# Patient Record
Sex: Female | Born: 1980 | Race: White | Hispanic: No | Marital: Single | Smoking: Former smoker
Health system: Southern US, Community
[De-identification: ages and names within clinical notes are randomized; demographics above are authoritative.]

## PROBLEM LIST (undated history)

## (undated) DIAGNOSIS — G40909 Epilepsy, unspecified, not intractable, without status epilepticus: Secondary | ICD-10-CM

## (undated) DIAGNOSIS — R51 Headache: Secondary | ICD-10-CM

## (undated) DIAGNOSIS — Z8614 Personal history of Methicillin resistant Staphylococcus aureus infection: Secondary | ICD-10-CM

## (undated) DIAGNOSIS — M1990 Unspecified osteoarthritis, unspecified site: Secondary | ICD-10-CM

## (undated) DIAGNOSIS — K529 Noninfective gastroenteritis and colitis, unspecified: Secondary | ICD-10-CM

## (undated) DIAGNOSIS — R112 Nausea with vomiting, unspecified: Secondary | ICD-10-CM

## (undated) DIAGNOSIS — N281 Cyst of kidney, acquired: Secondary | ICD-10-CM

## (undated) DIAGNOSIS — R519 Headache, unspecified: Secondary | ICD-10-CM

## (undated) DIAGNOSIS — Z789 Other specified health status: Secondary | ICD-10-CM

## (undated) DIAGNOSIS — N390 Urinary tract infection, site not specified: Secondary | ICD-10-CM

## (undated) DIAGNOSIS — J189 Pneumonia, unspecified organism: Secondary | ICD-10-CM

## (undated) DIAGNOSIS — Z9889 Other specified postprocedural states: Secondary | ICD-10-CM

## (undated) DIAGNOSIS — D219 Benign neoplasm of connective and other soft tissue, unspecified: Secondary | ICD-10-CM

## (undated) DIAGNOSIS — N200 Calculus of kidney: Secondary | ICD-10-CM

## (undated) DIAGNOSIS — F419 Anxiety disorder, unspecified: Secondary | ICD-10-CM

## (undated) HISTORY — PX: CHOLECYSTECTOMY: SHX55

## (undated) HISTORY — PX: UTERINE ARTERY EMBOLIZATION: SHX2629

## (undated) HISTORY — PX: WISDOM TOOTH EXTRACTION: SHX21

## (undated) HISTORY — PX: ABDOMINAL HYSTERECTOMY: SHX81

---

## 2012-06-12 ENCOUNTER — Encounter (HOSPITAL_COMMUNITY): Payer: Self-pay | Admitting: *Deleted

## 2012-06-12 ENCOUNTER — Emergency Department (HOSPITAL_COMMUNITY): Payer: Medicaid Other

## 2012-06-12 ENCOUNTER — Emergency Department (HOSPITAL_COMMUNITY)
Admission: EM | Admit: 2012-06-12 | Discharge: 2012-06-13 | Disposition: A | Discharge status: Home / Self Care | Payer: Medicaid Other | Attending: Emergency Medicine | Admitting: Emergency Medicine

## 2012-06-12 DIAGNOSIS — R5381 Other malaise: Secondary | ICD-10-CM | POA: Insufficient documentation

## 2012-06-12 DIAGNOSIS — N281 Cyst of kidney, acquired: Secondary | ICD-10-CM

## 2012-06-12 DIAGNOSIS — R109 Unspecified abdominal pain: Secondary | ICD-10-CM

## 2012-06-12 DIAGNOSIS — R5383 Other fatigue: Secondary | ICD-10-CM | POA: Insufficient documentation

## 2012-06-12 DIAGNOSIS — N83209 Unspecified ovarian cyst, unspecified side: Secondary | ICD-10-CM | POA: Insufficient documentation

## 2012-06-12 DIAGNOSIS — R142 Eructation: Secondary | ICD-10-CM | POA: Insufficient documentation

## 2012-06-12 DIAGNOSIS — R42 Dizziness and giddiness: Secondary | ICD-10-CM | POA: Insufficient documentation

## 2012-06-12 DIAGNOSIS — G40909 Epilepsy, unspecified, not intractable, without status epilepticus: Secondary | ICD-10-CM | POA: Insufficient documentation

## 2012-06-12 DIAGNOSIS — K5289 Other specified noninfective gastroenteritis and colitis: Secondary | ICD-10-CM | POA: Insufficient documentation

## 2012-06-12 DIAGNOSIS — R110 Nausea: Secondary | ICD-10-CM | POA: Insufficient documentation

## 2012-06-12 DIAGNOSIS — Q619 Cystic kidney disease, unspecified: Secondary | ICD-10-CM | POA: Insufficient documentation

## 2012-06-12 DIAGNOSIS — R141 Gas pain: Secondary | ICD-10-CM | POA: Insufficient documentation

## 2012-06-12 DIAGNOSIS — K529 Noninfective gastroenteritis and colitis, unspecified: Secondary | ICD-10-CM

## 2012-06-12 DIAGNOSIS — Z87891 Personal history of nicotine dependence: Secondary | ICD-10-CM | POA: Insufficient documentation

## 2012-06-12 DIAGNOSIS — R197 Diarrhea, unspecified: Secondary | ICD-10-CM | POA: Insufficient documentation

## 2012-06-12 DIAGNOSIS — Z79899 Other long term (current) drug therapy: Secondary | ICD-10-CM | POA: Insufficient documentation

## 2012-06-12 HISTORY — DX: Epilepsy, unspecified, not intractable, without status epilepticus: G40.909

## 2012-06-12 LAB — BASIC METABOLIC PANEL
BUN: 10 mg/dL (ref 6–23)
Calcium: 9.6 mg/dL (ref 8.4–10.5)
Creatinine, Ser: 0.69 mg/dL (ref 0.50–1.10)
GFR calc Af Amer: >90 mL/min (ref >90)

## 2012-06-12 LAB — CBC WITH DIFFERENTIAL/PLATELET
Basophils Absolute: 0 10*3/uL (ref 0.0–0.1)
Basophils Relative: 0 % (ref 0–1)
Eosinophils Relative: 1 % (ref 0–5)
HCT: 41.5 % (ref 36.0–46.0)
MCH: 31.9 pg (ref 26.0–34.0)
MCHC: 36.9 g/dL — ABNORMAL HIGH (ref 30.0–36.0)
MCV: 86.5 fL (ref 78.0–100.0)
Monocytes Absolute: 0.6 10*3/uL (ref 0.1–1.0)
RDW: 12.6 % (ref 11.5–15.5)

## 2012-06-12 LAB — HEPATIC FUNCTION PANEL
Alkaline Phosphatase: 67 U/L (ref 39–117)
Bilirubin, Direct: <0.1 mg/dL (ref 0.0–0.3)
Total Protein: 7.3 g/dL (ref 6.0–8.3)

## 2012-06-12 LAB — URINALYSIS, ROUTINE W REFLEX MICROSCOPIC
Bilirubin Urine: NEGATIVE
Hgb urine dipstick: NEGATIVE
Ketones, ur: NEGATIVE mg/dL
Protein, ur: NEGATIVE mg/dL
Urobilinogen, UA: 1 mg/dL (ref 0.0–1.0)

## 2012-06-12 LAB — LIPASE, BLOOD: Lipase: 39 U/L (ref 11–59)

## 2012-06-12 MED ORDER — FENTANYL CITRATE 0.05 MG/ML IJ SOLN
50.0000 ug | Freq: Once | INTRAMUSCULAR | Status: AC
  Administered 2012-06-12: 50 ug via INTRAVENOUS
  Filled 2012-06-12: qty 2

## 2012-06-12 MED ORDER — FENTANYL CITRATE 0.05 MG/ML IJ SOLN
100.0000 ug | Freq: Once | INTRAMUSCULAR | Status: AC
  Administered 2012-06-12: 100 ug via INTRAVENOUS
  Filled 2012-06-12: qty 2

## 2012-06-12 MED ORDER — ONDANSETRON HCL 4 MG/2ML IJ SOLN
4.0000 mg | Freq: Once | INTRAMUSCULAR | Status: AC
  Administered 2012-06-12: 4 mg via INTRAVENOUS
  Filled 2012-06-12: qty 2

## 2012-06-12 MED ORDER — HYDROCODONE-ACETAMINOPHEN 5-325 MG PO TABS: 1.0000 | ORAL_TABLET | ORAL | Status: DC | PRN

## 2012-06-12 MED ORDER — IOHEXOL 350 MG/ML SOLN
100.0000 mL | Freq: Once | INTRAVENOUS | Status: AC | PRN
  Administered 2012-06-12: 100 mL via INTRAVENOUS

## 2012-06-12 MED ORDER — SODIUM CHLORIDE 0.9 % IV BOLUS (SEPSIS)
1000.0000 mL | Freq: Once | INTRAVENOUS | Status: AC
  Administered 2012-06-12: 1000 mL via INTRAVENOUS

## 2012-06-12 MED ORDER — DIPHENOXYLATE-ATROPINE 2.5-0.025 MG PO TABS: 2.0000 | ORAL_TABLET | Freq: Four times a day (QID) | ORAL | Status: DC | PRN

## 2012-06-12 MED ORDER — DIPHENOXYLATE-ATROPINE 2.5-0.025 MG PO TABS
2.0000 | ORAL_TABLET | Freq: Once | ORAL | Status: AC
  Administered 2012-06-12: 2 via ORAL
  Filled 2012-06-12: qty 2

## 2012-06-12 NOTE — ED Notes (Signed)
Patient presented today with on going diarrhea since Saturday. She has taken OTC anti-diarrheal meds with no success. She was seen at [facility] on Saturday and was sent home with the Z-pack. She has right lower quad abdominal pain that radiates to midline and up to her diaphragm. She states she has had no oral intake in days because it goes right thru her.

## 2012-06-12 NOTE — ED Notes (Signed)
Blanket given to pt

## 2012-06-12 NOTE — ED Notes (Signed)
Called CT to advised that patient has finished contrast

## 2012-06-12 NOTE — ED Notes (Signed)
Pt has had diarrhea for one week and states that she has had right lower abdominal pain that is shooting up right side to ribs.  No vomiting

## 2012-06-12 NOTE — ED Notes (Signed)
Pt is currently in the bathroom.   

## 2012-06-12 NOTE — ED Provider Notes (Signed)
History     CSN: [id number]  Arrival date & time 06/12/12  1655   First MD Initiated Contact with Patient 06/12/12 1854      Chief Complaint  Patient presents with  . Diarrhea    1 week  . Abdominal Pain    (Consider location/radiation/quality/duration/timing/severity/associated sxs/prior treatment) HPI Comments: Janice Lambert presents with a one-week history of light brown nonbloody diarrhea occurring "anytime she eats or drinks" but also wakes her up in one to 2 times per night as well.  She reports abdominal pain that was at first generalized but has now localized to the right and mid lower abdomen and radiates into her right flank and right upper quadrant.  She does have nausea without emesis and denies fevers or chills.  She also denies dysuria and hematuria.  She has not had by mouth intake in 48 hours and every 2 anorexia and worsened symptoms when she eats and drinks.  She has taken an OTC generic antidiarrheal medicine with no relief.  She lives at home with husband and several children all whom are well.  She was seen 5 days ago at an urgent care Center in [place] when her symptoms first began and she was placed on a five-day course of Zithromax which has not improved her symptoms.  She vacationed at [place] for a weekend approximately 2 weeks ago with her family, but again family is without symptoms.  She has had no foreign travel.  Past surgical history is significant for cholecystectomy and an abdominal hysterectomy, to her knowledge she has her appendix.  The history is provided by the patient and the spouse.    Past Medical History  Diagnosis Date  . Epilepsy     Past Surgical History  Procedure Date  . Cholecystectomy   . Abdominal hysterectomy     No family history on file.  History  Substance Use Topics  . Smoking status: Former Games developer  . Smokeless tobacco: Not on file  . Alcohol Use: No    OB History    Grav Para Term Preterm Abortions TAB SAB Ect  Mult Living                  Review of Systems  Constitutional: Positive for fatigue. Negative for fever and chills.  HENT: Negative for congestion, sore throat and neck pain.   Eyes: Negative.   Respiratory: Negative for chest tightness and shortness of breath.   Cardiovascular: Negative for chest pain.  Gastrointestinal: Positive for nausea, abdominal pain, diarrhea and abdominal distention. Negative for vomiting, constipation and blood in stool.  Genitourinary: Negative.   Musculoskeletal: Negative for joint swelling and arthralgias.  Skin: Negative.  Negative for rash and wound.  Neurological: Positive for light-headedness. Negative for dizziness, weakness, numbness and headaches.  Hematological: Negative.   Psychiatric/Behavioral: Negative.     Allergies  Morphine and related and Penicillins  Home Medications   Current Outpatient Rx  Name Route Sig Dispense Refill  . DICYCLOMINE HCL 10 MG PO CAPS Oral Take 10 mg by mouth 3 (three) times daily as needed. For irritable bowel syndrome    . FLUOXETINE HCL 20 MG PO CAPS Oral Take 20 mg by mouth daily.    . TRAZODONE HCL 100 MG PO TABS Oral Take 100 mg by mouth at bedtime.    Marland Kitchen ZONISAMIDE 100 MG PO CAPS Oral Take 200 mg by mouth at bedtime.    Marland Kitchen DIPHENOXYLATE-ATROPINE 2.5-0.025 MG PO TABS Oral Take 2 tablets  by mouth 4 (four) times daily as needed for diarrhea or loose stools. 30 tablet 0  . HYDROCODONE-ACETAMINOPHEN 5-325 MG PO TABS Oral Take 1 tablet by mouth every 4 (four) hours as needed for pain. 15 tablet 0    BP 95/55  Pulse 57  Temp 98 F (36.7 C) (Oral)  Resp 20  SpO2 97%  Physical Exam  Nursing note and vitals reviewed. Constitutional: She appears well-developed and well-nourished.  HENT:  Head: Normocephalic and atraumatic.  Mouth/Throat: Mucous membranes are dry.  Eyes: Conjunctivae normal are normal.  Neck: Normal range of motion.  Cardiovascular: Normal rate, regular rhythm, normal heart sounds and  intact distal pulses.   Pulmonary/Chest: Effort normal and breath sounds normal. She has no wheezes.  Abdominal: Soft. Bowel sounds are normal. There is tenderness in the right lower quadrant and periumbilical area. There is tenderness at McBurney's point. There is no CVA tenderness.  Musculoskeletal: Normal range of motion.  Neurological: She is alert.  Skin: Skin is warm and dry.  Psychiatric: She has a normal mood and affect.    ED Course  Procedures (including critical care time)  Labs Reviewed  URINALYSIS, ROUTINE W REFLEX MICROSCOPIC - Abnormal; Notable for the following:    APPearance HAZY (*)     All other components within normal limits  CBC WITH DIFFERENTIAL - Abnormal; Notable for the following:    Hemoglobin 15.3 (*)     MCHC 36.9 (*)     All other components within normal limits  PREGNANCY, URINE  BASIC METABOLIC PANEL  HEPATIC FUNCTION PANEL  LIPASE, BLOOD  STOOL CULTURE  CLOSTRIDIUM DIFFICILE BY PCR   Ct Abdomen Pelvis W Contrast  06/12/2012  *RADIOLOGY REPORT*  Clinical Data: Right lower quadrant abdominal pain  CT ABDOMEN AND PELVIS WITH CONTRAST  Technique:  Multidetector CT imaging of the abdomen and pelvis was performed following the standard protocol during bolus administration of intravenous contrast.  Contrast: OMNIPAQUE IOHEXOL 350 MG/ML SOLN  Comparison: None.  Findings: Mild bibasilar opacities.  Trace pleural fluid.  Heart size within normal limits.  Nonspecific heterogeneous attenuation of the liver.  More focal hypoattenuation within the left lobe segment four is favored to reflect focal fat.  Unremarkable spleen, pancreas, adrenal glands. Absent gallbladder.  No biliary ductal dilatation.  11 mm hypodensity within the upper pole right kidney is incompletely characterized.  Otherwise, symmetric renal enhancement.  No hydronephrosis or hydroureter.  No bowel obstruction.  No CT evidence for colitis.  Normal appendix.  No free intraperitoneal air or fluid.   No lymphadenopathy.  Normal caliber vasculature.  Circumaortic left renal vein.  The uterus not identified.  Mildly complex right adnexal cyst measures 2.4 cm.  No free fluid within the pelvis.  No acute osseous finding.  IMPRESSION: Nonspecific right adnexal cyst is favored to be physiologic. Pelvic ultrasound can further evaluate if clinically warranted.  Normal appendix.  No hydronephrosis.  11 mm right upper pole hypodensity is favored to be a simple or mildly complex cyst.  Can be confirmed with ultrasound follow-up.   Original Report Authenticated By: Waneta Martins, M.D.      1. Enteritis   2. Abdominal cramping   3. Ovarian cyst   4. Renal cyst       MDM  Symptoms most c/w enteritis.  Pt has not been able to give bm sample in ed,  Although abd cramping persists, improves with doses of fentanyl.  Pt prescribed lomotil and hydrocodone for relief of pain  and diarrhea.  Encouraged brat diet.  Recheck by pcp in 2 days if not improving.  Also discussed need for [place] as outpatient to further evaluate right renal and right ovarian cyst. Although ovarian cyst could be source of right lower quad pain,  Does not explain pt diarrhea.  Favor infectious source for diarrhea and cramping.   Advised to discuss f/u US  with pcp.    Patients labs and/or radiological studies were reviewed during the medical decision making and disposition process. The patient appears reasonably screened and/or stabilized for discharge and I doubt any other medical condition or other [facility] requiring further screening, evaluation, or treatment in the ED at this time prior to discharge.         Burgess Amor, PA 06/12/12 2352  Burgess Amor, PA 06/12/12 2356

## 2012-06-12 NOTE — ED Notes (Signed)
Patient transported to CT 

## 2012-06-12 NOTE — ED Notes (Signed)
Pt back in room.

## 2012-06-13 NOTE — ED Notes (Addendum)
Pt BP low, finishing fluid bolus as ordered, will recheck BP and discharge pt home. Pt stated baseline BP in the low SBP low 90"s

## 2012-06-14 NOTE — ED Provider Notes (Signed)
Medical screening examination/treatment/procedure(s) were performed by non-physician practitioner and as supervising physician I was immediately available for consultation/collaboration.   Carleene Cooper III, MD 06/14/12 [phone]

## 2012-11-09 HISTORY — PX: COLONOSCOPY: SHX174

## 2014-02-24 IMAGING — CT CT ABD-PELV W/ CM
2 of 4 series · 17 of 46 positions shown, 19 images · IV contrast (omnipaque)
Comparison: None.

CLINICAL DATA: Right lower quadrant abdominal pain

CT ABDOMEN AND PELVIS WITH CONTRAST
TECHNIQUE: Multidetector CT imaging of the abdomen and pelvis was
performed following the standard protocol during bolus
administration of intravenous contrast.
Contrast: 100mL OMNIPAQUE IOHEXOL 350 MG/ML SOLN

[Series 2: routine abdomen · axial · 0.87mm/px · z∈[-525,-35]mm · 14 of 108 slices shown, 16 images]
[im 5/108  soft-tissue]
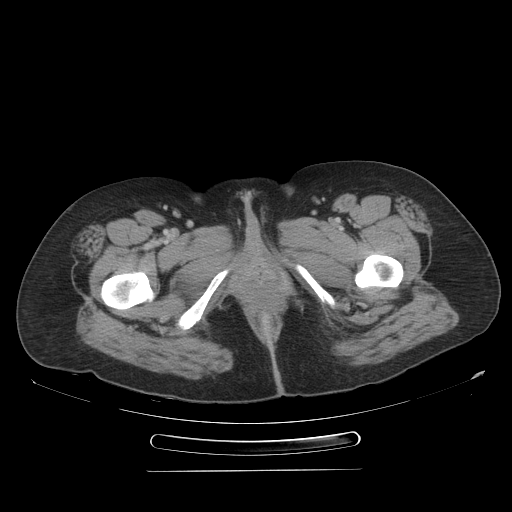
[im 5/108  bone]
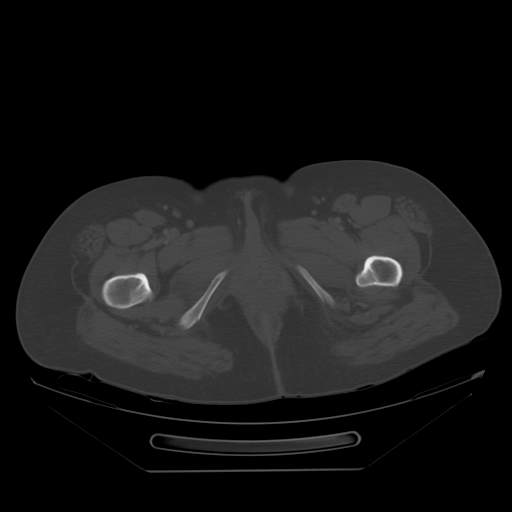
[im 13/108  soft-tissue]
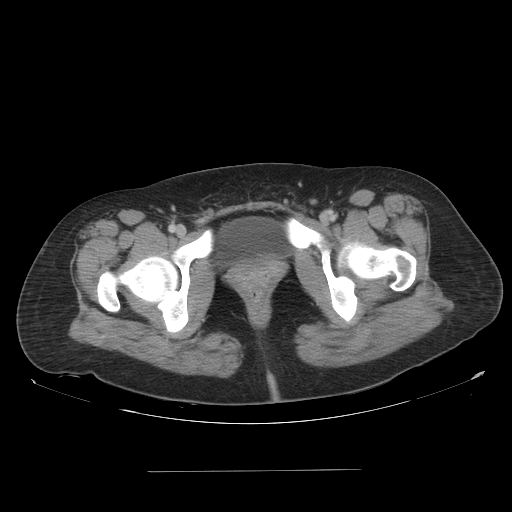
[im 22/108  soft-tissue]
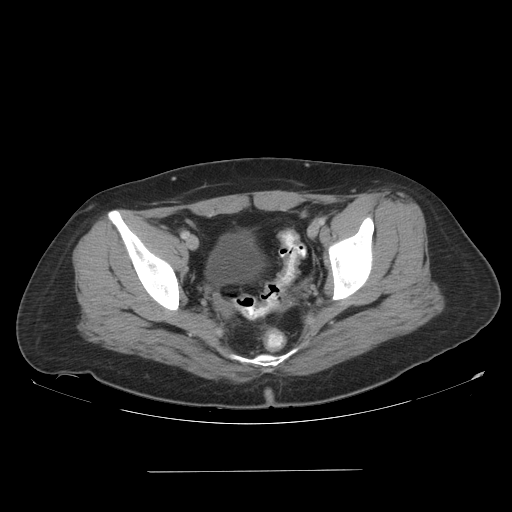
[im 30/108  soft-tissue]
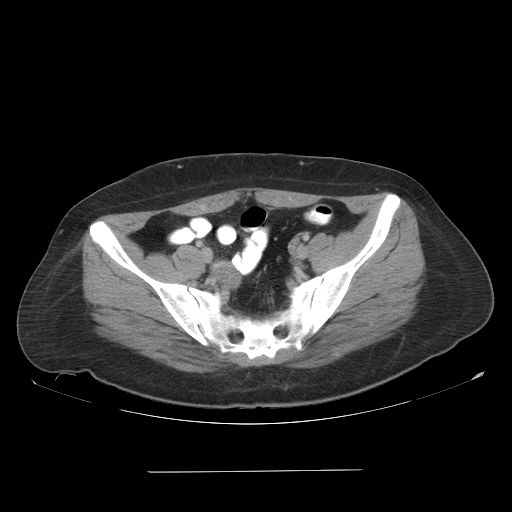
[im 35/108  soft-tissue]
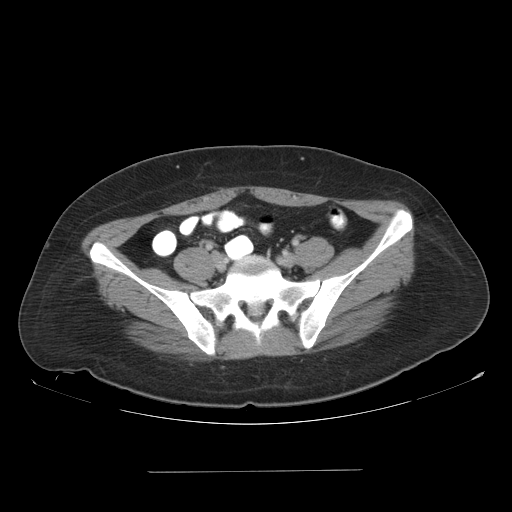
[im 43/108  soft-tissue]
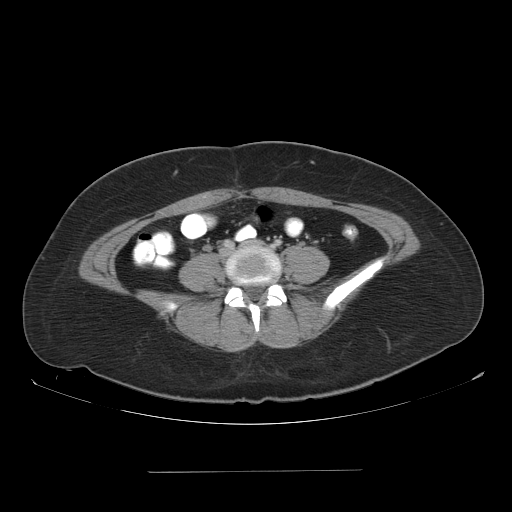
[im 52/108  soft-tissue]
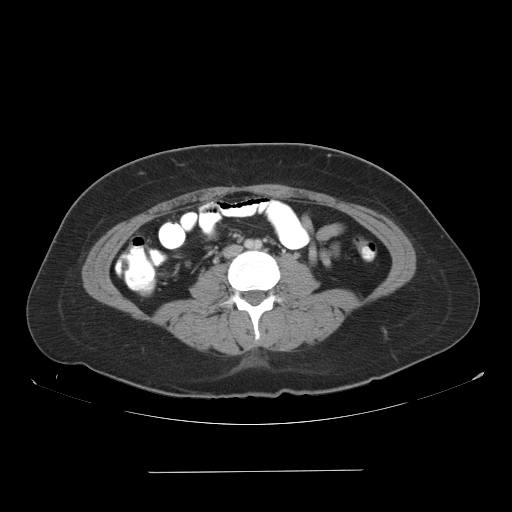
[im 56/108  soft-tissue]
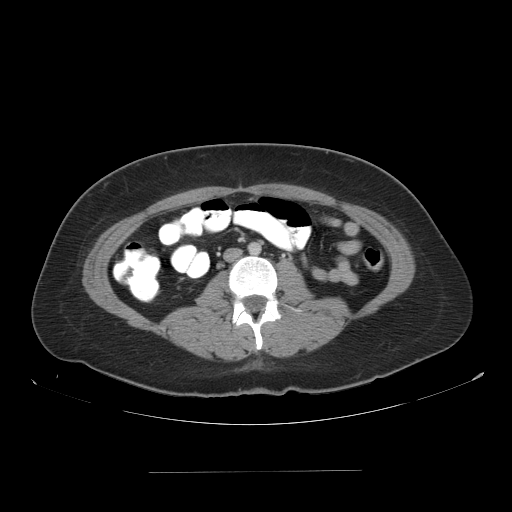
[im 65/108  soft-tissue]
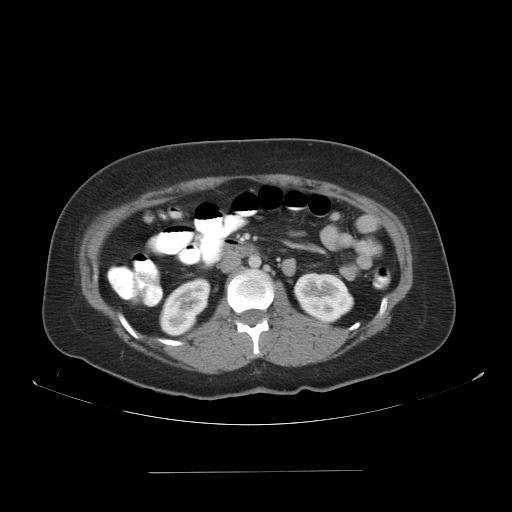
[im 65/108  bone]
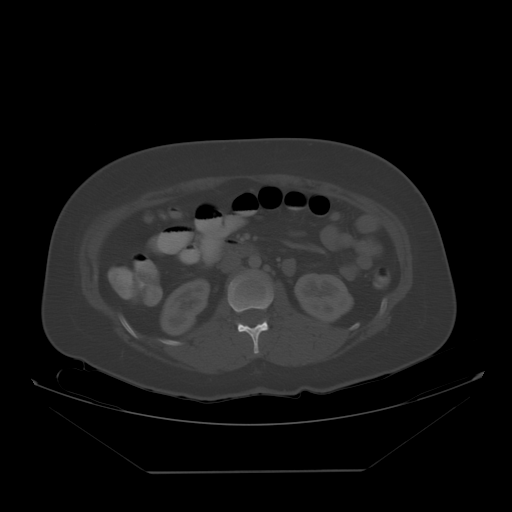
[im 73/108  soft-tissue]
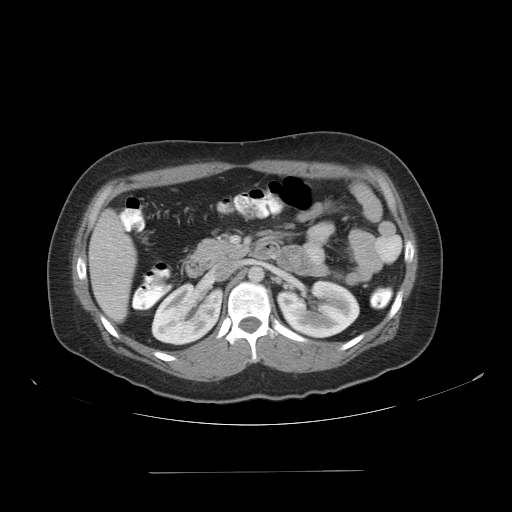
[im 82/108  soft-tissue]
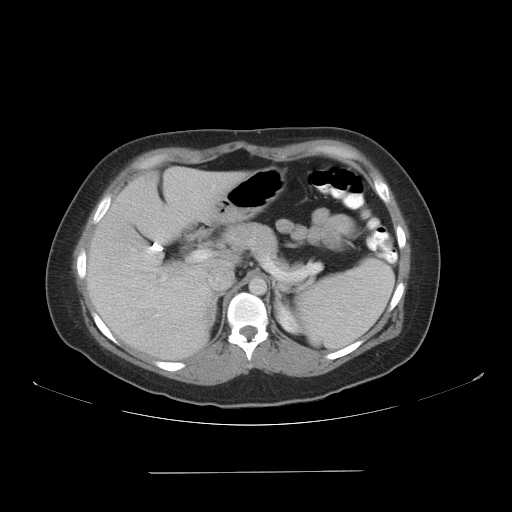
[im 86/108  soft-tissue]
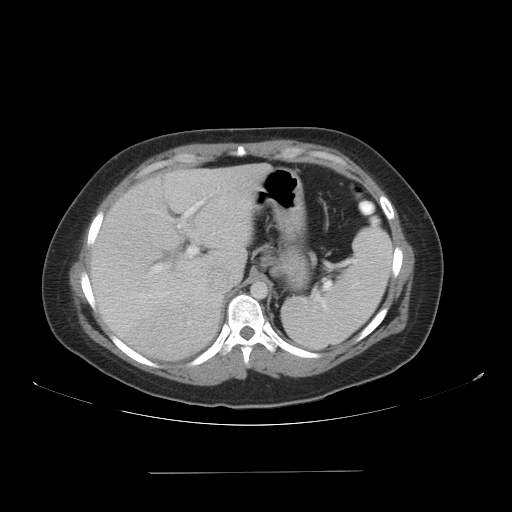
[im 95/108  soft-tissue]
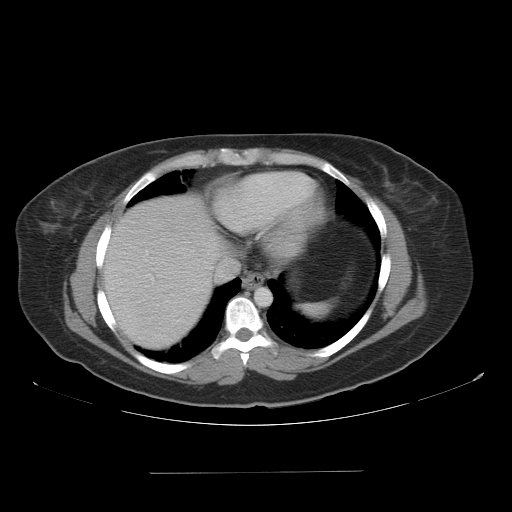
[im 103/108  soft-tissue]
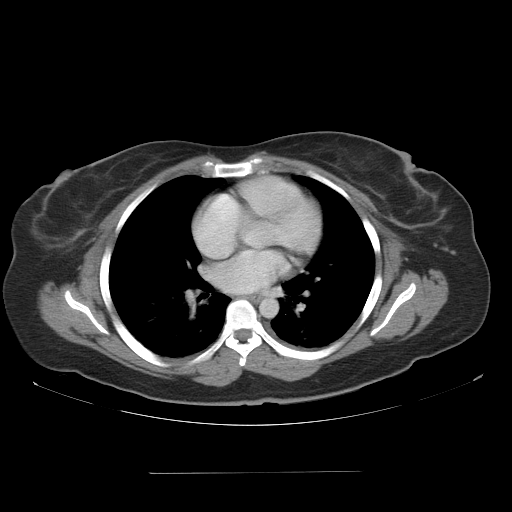

[Series 401: cor · coronal · 1.04mm/px · 3 of 88 slices shown]
[im 30/88  soft-tissue]
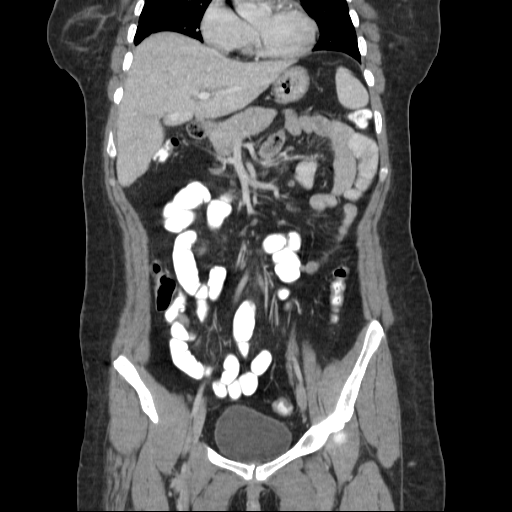
[im 39/88  soft-tissue]
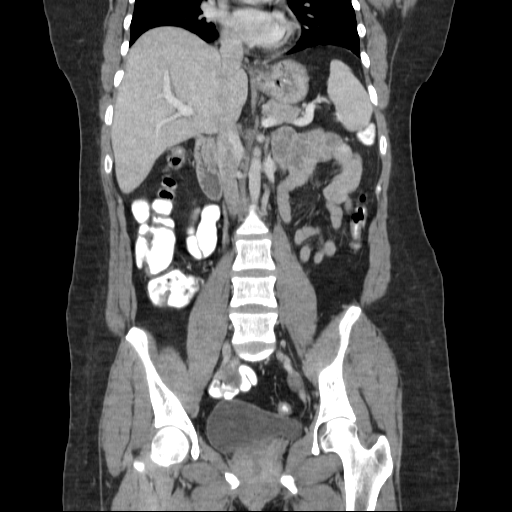
[im 49/88  soft-tissue]
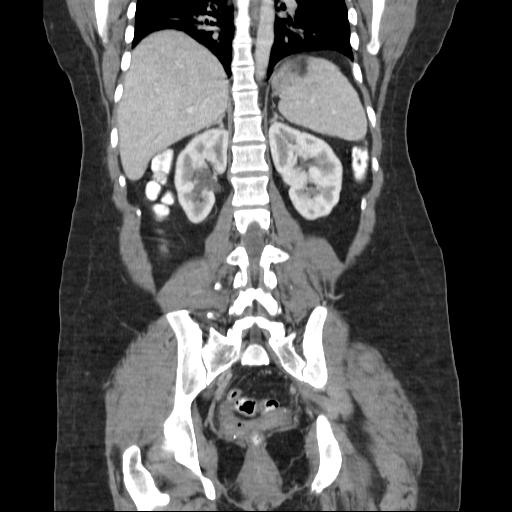

[17 of 46 positions shown; findings below may reference images not displayed]

FINDINGS: Mild bibasilar opacities.  Trace pleural fluid.  Heart
size within normal limits.

Nonspecific heterogeneous attenuation of the liver.  More focal
hypoattenuation within the left lobe segment four is favored to
reflect focal fat.  Unremarkable spleen, pancreas, adrenal glands.
Absent gallbladder.  No biliary ductal dilatation.

11 mm hypodensity within the upper pole right kidney is
incompletely characterized.  Otherwise, symmetric renal
enhancement.  No hydronephrosis or hydroureter.

No bowel obstruction.  No CT evidence for colitis.  Normal
appendix.  No free intraperitoneal air or fluid.  No
lymphadenopathy.

Normal caliber vasculature.  Circumaortic left renal vein.

The uterus not identified.  Mildly complex right adnexal cyst
measures 2.4 cm.  No free fluid within the pelvis.

No acute osseous finding.
IMPRESSION: Nonspecific right adnexal cyst is favored to be physiologic. Pelvic
ultrasound can further evaluate if clinically warranted.

Normal appendix.  No hydronephrosis.

11 mm right upper pole hypodensity is favored to be a simple or
mildly complex cyst.  Can be confirmed with ultrasound follow-up.

## 2015-03-31 ENCOUNTER — Other Ambulatory Visit (HOSPITAL_COMMUNITY): Payer: Self-pay | Admitting: *Deleted

## 2015-03-31 ENCOUNTER — Other Ambulatory Visit: Payer: Self-pay | Admitting: Orthopedic Surgery

## 2015-03-31 NOTE — Pre-Procedure Instructions (Signed)
Janice Lambert  03/31/2015      Your procedure is scheduled on Monday, April 05, 2015 at 10:15 AM.   Report to [facility] Entrance "A" Admitting Office at 8:15 AM.   Call this number if you have problems the morning of surgery: [phone]   Any questions prior to day of surgery, please call [phone] between 8 & 4 PM.    Remember:  Do not eat food or drink liquids after midnight Sunday, 04/04/15.  Take these medicines the morning of surgery with A SIP OF WATER: Topiramate (Topamax), Oxycodone - if needed, Alprazolam (Xanax) - if needed   Do not wear jewelry, make-up or nail polish.  Do not wear lotions, powders, or perfumes.  You may wear deodorant.  Do not shave 48 hours prior to surgery.    Do not bring valuables to the hospital.  [facility] is not responsible for any belongings or valuables.  Contacts, dentures or bridgework may not be worn into surgery.  Leave your suitcase in the car.  After surgery it may be brought to your room.  For patients admitted to the hospital, discharge time will be determined by your treatment team.  Patients discharged the day of surgery will not be allowed to drive home.   Special instructions:  [place] - Preparing for Surgery  Before surgery, you can play an important role.  Because skin is not sterile, your skin needs to be as free of germs as possible.  You can reduce the number of germs on you skin by washing with CHG (chlorahexidine gluconate) soap before surgery.  CHG is an antiseptic cleaner which kills germs and bonds with the skin to continue killing germs even after washing.  Please DO NOT use if you have an allergy to CHG or antibacterial soaps.  If your skin becomes reddened/irritated stop using the CHG and inform your nurse when you arrive at Short Stay.  Do not shave (including legs and underarms) for at least 48 hours prior to the first CHG shower.  You may shave your face.  Please follow these instructions  carefully:   1.  Shower with CHG Soap the night before surgery and the                                morning of Surgery.  2.  If you choose to wash your hair, wash your hair first as usual with your       normal shampoo.  3.  After you shampoo, rinse your hair and body thoroughly to remove the                      Shampoo.  4.  Use CHG as you would any other liquid soap.  You can apply chg directly       to the skin and wash gently with scrungie or a clean washcloth.  5.  Apply the CHG Soap to your body ONLY FROM THE NECK DOWN.        Do not use on open wounds or open sores.  Avoid contact with your eyes, ears, mouth and genitals (private parts).  Wash genitals (private parts) with your normal soap.  6.  Wash thoroughly, paying special attention to the area where your surgery        will be performed.  7.  Thoroughly rinse your body with warm water from the  neck down.  8.  DO NOT shower/wash with your normal soap after using and rinsing off       the CHG Soap.  9.  Pat yourself dry with a clean towel.            10.  Wear clean pajamas.            11.  Place clean sheets on your bed the night of your first shower and do not        sleep with pets.  Day of Surgery  Do not apply any lotions the morning of surgery.  Please wear clean clothes to the hospital.     Please read over the following fact sheets that you were given. Pain Booklet, Coughing and Deep Breathing and Surgical Site Infection Prevention

## 2015-04-01 ENCOUNTER — Other Ambulatory Visit: Payer: Self-pay | Admitting: Orthopedic Surgery

## 2015-04-01 ENCOUNTER — Encounter (HOSPITAL_COMMUNITY)
Admission: RE | Admit: 2015-04-01 | Discharge: 2015-04-01 | Disposition: A | Discharge status: Home / Self Care | Payer: Medicaid Other | Source: Ambulatory Visit | Attending: Orthopedic Surgery | Admitting: Orthopedic Surgery

## 2015-04-01 ENCOUNTER — Encounter (HOSPITAL_COMMUNITY): Payer: Self-pay

## 2015-04-01 DIAGNOSIS — M949 Disorder of cartilage, unspecified: Secondary | ICD-10-CM | POA: Diagnosis not present

## 2015-04-01 DIAGNOSIS — Z01812 Encounter for preprocedural laboratory examination: Secondary | ICD-10-CM | POA: Diagnosis present

## 2015-04-01 HISTORY — DX: Urinary tract infection, site not specified: N39.0

## 2015-04-01 HISTORY — DX: Benign neoplasm of connective and other soft tissue, unspecified: D21.9

## 2015-04-01 HISTORY — DX: Nausea with vomiting, unspecified: Z98.890

## 2015-04-01 HISTORY — DX: Noninfective gastroenteritis and colitis, unspecified: K52.9

## 2015-04-01 HISTORY — DX: Pneumonia, unspecified organism: J18.9

## 2015-04-01 HISTORY — DX: Headache, unspecified: R51.9

## 2015-04-01 HISTORY — DX: Personal history of Methicillin resistant Staphylococcus aureus infection: Z86.14

## 2015-04-01 HISTORY — DX: Unspecified osteoarthritis, unspecified site: M19.90

## 2015-04-01 HISTORY — DX: Headache: R51

## 2015-04-01 HISTORY — DX: Anxiety disorder, unspecified: F41.9

## 2015-04-01 HISTORY — DX: Calculus of kidney: N20.0

## 2015-04-01 HISTORY — DX: Nausea with vomiting, unspecified: R11.2

## 2015-04-01 HISTORY — DX: Other specified health status: Z78.9

## 2015-04-01 HISTORY — DX: Cyst of kidney, acquired: N28.1

## 2015-04-01 LAB — CBC
HEMATOCRIT: 45.5 % (ref 36.0–46.0)
Hemoglobin: 15.4 g/dL — ABNORMAL HIGH (ref 12.0–15.0)
MCH: 29.1 pg (ref 26.0–34.0)
MCHC: 33.8 g/dL (ref 30.0–36.0)
MCV: 85.8 fL (ref 78.0–100.0)
PLATELETS: 312 10*3/uL (ref 150–400)
RBC: 5.3 MIL/uL — AB (ref 3.87–5.11)
RDW: 12.8 % (ref 11.5–15.5)
WBC: 6.9 10*3/uL (ref 4.0–10.5)

## 2015-04-01 LAB — BASIC METABOLIC PANEL
ANION GAP: 7 (ref 5–15)
BUN: 10 mg/dL (ref 6–20)
CO2: 22 mmol/L (ref 22–32)
Calcium: 9 mg/dL (ref 8.9–10.3)
Chloride: 109 mmol/L (ref 101–111)
Creatinine, Ser: 0.86 mg/dL (ref 0.44–1.00)
GFR calc Af Amer: >60 mL/min (ref >60)
GFR calc non Af Amer: >60 mL/min (ref >60)
Glucose, Bld: 105 mg/dL — ABNORMAL HIGH (ref 65–99)
Potassium: 3.8 mmol/L (ref 3.5–5.1)
Sodium: 138 mmol/L (ref 135–145)

## 2015-04-01 NOTE — Progress Notes (Signed)
Called Dr. Ruel Favors office and spoke with Carlynn Spry, PA about consent form order. It had an abbreviation "OCD" in the indication/reason section of the consent form. 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 I informed him that it was not an approved abbreviation and asked if he would change it. He stated that he would put in another consent order.

## 2015-04-01 NOTE — Progress Notes (Signed)
Mupirocin Ointment Rx called into [facility] in [place], [place] for positive PCR of MRSA and Staph. Left message on pt's voicemail notifying her of positive results.

## 2015-04-02 LAB — SURGICAL PCR SCREEN
MRSA, PCR: POSITIVE — AB
STAPHYLOCOCCUS AUREUS: POSITIVE — AB

## 2015-04-04 MED ORDER — VANCOMYCIN HCL IN DEXTROSE 1-5 GM/200ML-% IV SOLN
1000.0000 mg | INTRAVENOUS | Status: AC
  Administered 2015-04-05: 1000 mg via INTRAVENOUS
  Filled 2015-04-04: qty 200

## 2015-04-05 ENCOUNTER — Ambulatory Visit (HOSPITAL_COMMUNITY): Payer: Medicaid Other | Admitting: Anesthesiology

## 2015-04-05 ENCOUNTER — Encounter (HOSPITAL_COMMUNITY)
Admission: RE | Disposition: A | Discharge status: Home / Self Care | Payer: Self-pay | Source: Ambulatory Visit | Attending: Orthopedic Surgery

## 2015-04-05 ENCOUNTER — Encounter (HOSPITAL_COMMUNITY): Payer: Self-pay | Admitting: *Deleted

## 2015-04-05 ENCOUNTER — Ambulatory Visit (HOSPITAL_COMMUNITY)
Admission: RE | Admit: 2015-04-05 | Discharge: 2015-04-05 | Disposition: A | Discharge status: Home / Self Care | Payer: Medicaid Other | Source: Ambulatory Visit | Attending: Orthopedic Surgery | Admitting: Orthopedic Surgery

## 2015-04-05 DIAGNOSIS — Z87891 Personal history of nicotine dependence: Secondary | ICD-10-CM | POA: Insufficient documentation

## 2015-04-05 DIAGNOSIS — M6751 Plica syndrome, right knee: Secondary | ICD-10-CM | POA: Insufficient documentation

## 2015-04-05 DIAGNOSIS — Z8614 Personal history of Methicillin resistant Staphylococcus aureus infection: Secondary | ICD-10-CM | POA: Insufficient documentation

## 2015-04-05 DIAGNOSIS — M179 Osteoarthritis of knee, unspecified: Secondary | ICD-10-CM | POA: Insufficient documentation

## 2015-04-05 DIAGNOSIS — Z6837 Body mass index (BMI) 37.0-37.9, adult: Secondary | ICD-10-CM | POA: Diagnosis not present

## 2015-04-05 DIAGNOSIS — G40909 Epilepsy, unspecified, not intractable, without status epilepticus: Secondary | ICD-10-CM | POA: Diagnosis not present

## 2015-04-05 DIAGNOSIS — M25561 Pain in right knee: Secondary | ICD-10-CM | POA: Diagnosis present

## 2015-04-05 DIAGNOSIS — F419 Anxiety disorder, unspecified: Secondary | ICD-10-CM | POA: Insufficient documentation

## 2015-04-05 DIAGNOSIS — M1611 Unilateral primary osteoarthritis, right hip: Secondary | ICD-10-CM | POA: Insufficient documentation

## 2015-04-05 DIAGNOSIS — M21851 Other specified acquired deformities of right thigh: Secondary | ICD-10-CM | POA: Diagnosis not present

## 2015-04-05 HISTORY — PX: KNEE ARTHROSCOPY: SHX127

## 2015-04-05 SURGERY — ARTHROSCOPY, KNEE
Anesthesia: General | Site: Knee | Laterality: Right

## 2015-04-05 MED ORDER — DEXAMETHASONE SODIUM PHOSPHATE 4 MG/ML IJ SOLN
INTRAMUSCULAR | Status: AC
  Filled 2015-04-05: qty 1

## 2015-04-05 MED ORDER — SODIUM CHLORIDE 0.9 % IJ SOLN
INTRAMUSCULAR | Status: AC
  Filled 2015-04-05: qty 10

## 2015-04-05 MED ORDER — BUPIVACAINE-EPINEPHRINE (PF) 0.5% -1:200000 IJ SOLN
INTRAMUSCULAR | Status: AC
  Filled 2015-04-05: qty 30

## 2015-04-05 MED ORDER — DIPHENHYDRAMINE HCL 50 MG/ML IJ SOLN
INTRAMUSCULAR | Status: AC
  Filled 2015-04-05: qty 1

## 2015-04-05 MED ORDER — MIDAZOLAM HCL 5 MG/5ML IJ SOLN
INTRAMUSCULAR | Status: DC | PRN
  Administered 2015-04-05 (×2): 2 mg via INTRAVENOUS

## 2015-04-05 MED ORDER — DIPHENHYDRAMINE HCL 50 MG/ML IJ SOLN
INTRAMUSCULAR | Status: DC | PRN
  Administered 2015-04-05: 25 mg via INTRAVENOUS

## 2015-04-05 MED ORDER — OXYCODONE HCL 5 MG PO TABS
5.0000 mg | ORAL_TABLET | Freq: Once | ORAL | Status: AC | PRN
  Administered 2015-04-05: 5 mg via ORAL

## 2015-04-05 MED ORDER — PHENYLEPHRINE HCL 10 MG/ML IJ SOLN
INTRAMUSCULAR | Status: DC | PRN
  Administered 2015-04-05: 40 ug via INTRAVENOUS

## 2015-04-05 MED ORDER — DEXAMETHASONE SODIUM PHOSPHATE 4 MG/ML IJ SOLN
INTRAMUSCULAR | Status: DC | PRN
  Administered 2015-04-05: 4 mg via INTRAVENOUS

## 2015-04-05 MED ORDER — MIDAZOLAM HCL 2 MG/2ML IJ SOLN
2.0000 mg | Freq: Once | INTRAMUSCULAR | Status: DC
  Filled 2015-04-05: qty 2

## 2015-04-05 MED ORDER — CHLORHEXIDINE GLUCONATE 4 % EX LIQD: 60.0000 mL | Freq: Once | CUTANEOUS | Status: DC

## 2015-04-05 MED ORDER — KETOROLAC TROMETHAMINE 30 MG/ML IJ SOLN
30.0000 mg | Freq: Once | INTRAMUSCULAR | Status: AC | PRN
  Administered 2015-04-05: 30 mg via INTRAVENOUS

## 2015-04-05 MED ORDER — MIDAZOLAM HCL 2 MG/2ML IJ SOLN
INTRAMUSCULAR | Status: AC
  Administered 2015-04-05: 2 mg
  Filled 2015-04-05: qty 2

## 2015-04-05 MED ORDER — SUCCINYLCHOLINE CHLORIDE 20 MG/ML IJ SOLN
INTRAMUSCULAR | Status: AC
  Filled 2015-04-05: qty 1

## 2015-04-05 MED ORDER — FENTANYL CITRATE (PF) 250 MCG/5ML IJ SOLN
INTRAMUSCULAR | Status: AC
  Filled 2015-04-05: qty 5

## 2015-04-05 MED ORDER — OXYCODONE HCL 5 MG/5ML PO SOLN: 5.0000 mg | Freq: Once | ORAL | Status: AC | PRN

## 2015-04-05 MED ORDER — PROMETHAZINE HCL 25 MG/ML IJ SOLN
INTRAMUSCULAR | Status: AC
  Filled 2015-04-05: qty 1

## 2015-04-05 MED ORDER — OXYCODONE HCL 10 MG PO TABS: 10.0000 mg | ORAL_TABLET | ORAL | Status: DC | PRN

## 2015-04-05 MED ORDER — KETOROLAC TROMETHAMINE 30 MG/ML IJ SOLN
INTRAMUSCULAR | Status: AC
  Filled 2015-04-05: qty 1

## 2015-04-05 MED ORDER — OXYCODONE HCL 5 MG PO TABS
ORAL_TABLET | ORAL | Status: AC
  Filled 2015-04-05: qty 1

## 2015-04-05 MED ORDER — CALCIUM CHLORIDE 10 % IV SOLN
INTRAVENOUS | Status: AC
  Filled 2015-04-05: qty 10

## 2015-04-05 MED ORDER — PROMETHAZINE HCL 25 MG/ML IJ SOLN
6.2500 mg | INTRAMUSCULAR | Status: DC | PRN
  Administered 2015-04-05: 6.25 mg via INTRAVENOUS

## 2015-04-05 MED ORDER — PHENYLEPHRINE 40 MCG/ML (10ML) SYRINGE FOR IV PUSH (FOR BLOOD PRESSURE SUPPORT)
PREFILLED_SYRINGE | INTRAVENOUS | Status: AC
  Filled 2015-04-05: qty 20

## 2015-04-05 MED ORDER — LACTATED RINGERS IV SOLN
INTRAVENOUS | Status: DC
  Administered 2015-04-05: 09:00:00 via INTRAVENOUS

## 2015-04-05 MED ORDER — LIDOCAINE HCL (CARDIAC) 20 MG/ML IV SOLN
INTRAVENOUS | Status: DC | PRN
  Administered 2015-04-05: 100 mg via INTRAVENOUS

## 2015-04-05 MED ORDER — MIDAZOLAM HCL 2 MG/2ML IJ SOLN
INTRAMUSCULAR | Status: AC
  Filled 2015-04-05: qty 2

## 2015-04-05 MED ORDER — IBUPROFEN 800 MG PO TABS
800.0000 mg | ORAL_TABLET | Freq: Three times a day (TID) | ORAL | Status: DC | PRN
Start: 2015-04-05 — End: 2015-12-04

## 2015-04-05 MED ORDER — HYDROMORPHONE HCL 1 MG/ML IJ SOLN
INTRAMUSCULAR | Status: AC
  Filled 2015-04-05: qty 1

## 2015-04-05 MED ORDER — HYDROMORPHONE HCL 1 MG/ML IJ SOLN
0.2500 mg | INTRAMUSCULAR | Status: DC | PRN
  Administered 2015-04-05 (×2): 0.5 mg via INTRAVENOUS

## 2015-04-05 MED ORDER — FENTANYL CITRATE (PF) 250 MCG/5ML IJ SOLN
INTRAMUSCULAR | Status: DC | PRN
  Administered 2015-04-05: 50 ug via INTRAVENOUS
  Administered 2015-04-05: 100 ug via INTRAVENOUS
  Administered 2015-04-05 (×2): 50 ug via INTRAVENOUS

## 2015-04-05 MED ORDER — EPHEDRINE SULFATE 50 MG/ML IJ SOLN
INTRAMUSCULAR | Status: AC
  Filled 2015-04-05: qty 1

## 2015-04-05 MED ORDER — ONDANSETRON HCL 4 MG/2ML IJ SOLN
INTRAMUSCULAR | Status: AC
  Filled 2015-04-05: qty 2

## 2015-04-05 MED ORDER — PROPOFOL 10 MG/ML IV BOLUS
INTRAVENOUS | Status: DC | PRN
  Administered 2015-04-05: 200 mg via INTRAVENOUS

## 2015-04-05 SURGICAL SUPPLY — 39 items
BANDAGE ELASTIC 6 VELCRO ST LF (GAUZE/BANDAGES/DRESSINGS) ×3 IMPLANT
BLADE GREAT WHITE 4.2 (BLADE) ×2 IMPLANT
BLADE GREAT WHITE 4.2MM (BLADE) ×1
BUR OVAL 4.0 (BURR) ×3 IMPLANT
DRAPE ARTHROSCOPY W/POUCH 114 (DRAPES) ×3 IMPLANT
DRAPE U-SHAPE 47X51 STRL (DRAPES) ×3 IMPLANT
DRSG PAD ABDOMINAL 8X10 ST (GAUZE/BANDAGES/DRESSINGS) ×3 IMPLANT
DUO SET 2ML/ 4ML ×3 IMPLANT
ELECT REM PT RETURN 9FT ADLT (ELECTROSURGICAL)
ELECTRODE REM PT RTRN 9FT ADLT (ELECTROSURGICAL) IMPLANT
FIBRIN SEALANT TISSEEL ×3 IMPLANT
GAUZE SPONGE 4X4 12PLY STRL (GAUZE/BANDAGES/DRESSINGS) ×3 IMPLANT
GAUZE XEROFORM 1X8 LF (GAUZE/BANDAGES/DRESSINGS) ×3 IMPLANT
GLOVE BIOGEL PI IND STRL 8.5 (GLOVE) ×2 IMPLANT
GLOVE BIOGEL PI INDICATOR 8.5 (GLOVE) ×4
GLOVE SURG ORTHO 8.0 STRL STRW (GLOVE) ×6 IMPLANT
GOWN STRL REUS W/ TWL LRG LVL3 (GOWN DISPOSABLE) ×1 IMPLANT
GOWN STRL REUS W/TWL LRG LVL3 (GOWN DISPOSABLE) ×2
GOWN STRL REUS W/TWL XL LVL3 (GOWN DISPOSABLE) ×6 IMPLANT
KIT ROOM TURNOVER OR (KITS) ×3 IMPLANT
MANIFOLD NEPTUNE II (INSTRUMENTS) ×3 IMPLANT
MEMBRANE AMNIO 2.5X2.5 AFFNTY (Miscellaneous) ×3 IMPLANT
PACK ARTHROSCOPY DSU (CUSTOM PROCEDURE TRAY) ×3 IMPLANT
PAD ARMBOARD 7.5X6 YLW CONV (MISCELLANEOUS) ×6 IMPLANT
PADDING CAST COTTON 6X4 STRL (CAST SUPPLIES) ×3 IMPLANT
PENCIL BUTTON HOLSTER BLD 10FT (ELECTRODE) IMPLANT
SET ARTHROSCOPY TUBING (MISCELLANEOUS) ×2
SET ARTHROSCOPY TUBING LN (MISCELLANEOUS) ×1 IMPLANT
SPEAR EYE SURG WECK-CEL (MISCELLANEOUS) ×3 IMPLANT
SPONGE GAUZE 4X4 12PLY STER LF (GAUZE/BANDAGES/DRESSINGS) ×3 IMPLANT
SPONGE LAP 4X18 X RAY DECT (DISPOSABLE) ×3 IMPLANT
SUCTION FRAZIER TIP 10 FR DISP (SUCTIONS) ×3 IMPLANT
SUT ETHILON 4 0 PS 2 18 (SUTURE) ×3 IMPLANT
SYR 30ML LL (SYRINGE) ×6 IMPLANT
TISSEEL SPRAY SET ×3 IMPLANT
TOWEL OR 17X24 6PK STRL BLUE (TOWEL DISPOSABLE) ×3 IMPLANT
TOWEL OR 17X26 10 PK STRL BLUE (TOWEL DISPOSABLE) ×3 IMPLANT
WAND HAND CNTRL MULTIVAC 90 (MISCELLANEOUS) IMPLANT
WATER STERILE IRR 1000ML POUR (IV SOLUTION) ×3 IMPLANT

## 2015-04-05 NOTE — Progress Notes (Signed)
Pt c/o anxiety. Dr Ola Spurr called and informed, states he will be over to see her.

## 2015-04-05 NOTE — Anesthesia Preprocedure Evaluation (Addendum)
Anesthesia Evaluation  Patient identified by MRN, date of birth, ID band Patient awake    Reviewed: Allergy & Precautions, NPO status , Patient's Chart, lab work & pertinent test results  History of Anesthesia Complications (+) PONV  Airway Mallampati: II  TM Distance: <3 FB Neck ROM: Full    Dental   Pulmonary former smoker,  breath sounds clear to auscultation        Cardiovascular negative cardio ROS  Rhythm:Regular Rate:Normal     Neuro/Psych Seizures -, Well Controlled,  Anxiety    GI/Hepatic negative GI ROS, Neg liver ROS,   Endo/Other  Morbid obesity  Renal/GU negative Renal ROS     Musculoskeletal  (+) Arthritis -,   Abdominal   Peds  Hematology negative hematology ROS (+)   Anesthesia Other Findings   Reproductive/Obstetrics                            Anesthesia Physical Anesthesia Plan  ASA: III  Anesthesia Plan: General   Post-op Pain Management:    Induction: Intravenous  Airway Management Planned: LMA  Additional Equipment:   Intra-op Plan:   Post-operative Plan:   Informed Consent: I have reviewed the patients History and Physical, chart, labs and discussed the procedure including the risks, benefits and alternatives for the proposed anesthesia with the patient or authorized representative who has indicated his/her understanding and acceptance.     Plan Discussed with: CRNA  Anesthesia Plan Comments:         Anesthesia Quick Evaluation

## 2015-04-05 NOTE — H&P (Signed)
NAME: Janice Lambert MRN:   [id number] DOB:   [birth year]     HISTORY AND PHYSICAL  CHIEF COMPLAINT:  Right knee pain  HISTORY:   Janice Lambert a 34 y.o. female  with right  Knee Pain Patient presents for follow up on a knee problem involving the right knee. Onset of the symptoms was several years ago. Inciting event: Injury from several years ago. Current symptoms include giving out. Pain is aggravated by any weight bearing. Patient has had no prior knee problems. Evaluation to date: plain films: normal and MRI: abnormal OCD lesion. Treatment to date: avoidance of offending activity, ice, OTC analgesics which are not very effective, prescription NSAIDS which are not very effective and PT which was not very effective.   PAST MEDICAL HISTORY:   Past Medical History  Diagnosis Date  . Pneumonia     6 or 7 times as a child  . Anxiety   . Kidney stones   . Kidney cysts     right kidney  . Frequent UTI     pt self caths, not as bad since she is boiling catheters more often  . Epilepsy     Adult onset, last seizure 05/2014  . Headache     will get prior to seizure  . Arthritis     osteoarthritis right knee and hip  . Colitis   . Fibroid tumor     hx of - had hysterectomy  . PONV (postoperative nausea and vomiting)     after wisdom teeth surgery. Had hysterectomy since and didn't have any n/v with it  . Hx MRSA infection     07/2014  . Self-catheterizes urinary bladder     PAST SURGICAL HISTORY:   Past Surgical History  Procedure Laterality Date  . Cholecystectomy    . Abdominal hysterectomy    . Cesarean section    . Uterine artery embolization    . Colonoscopy  11/2012  . Wisdom tooth extraction      MEDICATIONS:   No prescriptions prior to admission    ALLERGIES:   Allergies  Allergen Reactions  . Penicillins Anaphylaxis  . Morphine And Related     States she can take Morphine, just needs Benadryl    REVIEW OF SYSTEMS:   Negative except HPI  FAMILY HISTORY:    Family History  Problem Relation Age of Onset  . CAD Mother   . Hypertension Mother   . Diabetes type II Mother     SOCIAL HISTORY:   reports that she quit smoking about 17 years ago. She has never used smokeless tobacco. She reports that she drinks alcohol. She reports that she does not use illicit drugs.  PHYSICAL EXAM:  General appearance: alert, cooperative and no distress Resp: clear to auscultation bilaterally Cardio: regular rate and rhythm, S1, S2 normal, no murmur, click, rub or gallop GI: soft, non-tender; bowel sounds normal; no masses,  no organomegaly Extremities: extremities normal, atraumatic, no cyanosis or edema and Homans sign is negative, no sign of DVT Pulses: 2+ and symmetric Skin: Skin color, texture, turgor normal. No rashes or lesions Incision/Wound:    LABORATORY STUDIES: No results for input(s): WBC, HGB, HCT, PLT in the last 72 hours.  No results for input(s): NA, K, CL, CO2, GLUCOSE, BUN, CREATININE, CALCIUM in the last 72 hours.  STUDIES/RESULTS:  MRI: OCD lesion stable in size  ASSESSMENT: Osteochondrodrial Defect         Active Problems:   * No active  hospital problems. *    PLAN: Right knee arthroplasty with affinity  Marley Pakula 04/05/2015. 7:07 AM

## 2015-04-05 NOTE — Discharge Instructions (Signed)
Diet: As you were doing prior to hospitalization   Activity:  Increase activity slowly as tolerated                  No lifting or driving for 6 weeks  Shower:  May shower without a dressing once there is no drainage from your wound.                 Do NOT wash over the wound.                 Dressing:  You may change your dressing on Wednesday                    Then change the dressing daily with sterile 4"x4"s gauze dressing                     And TED hose for knees.  Weight Bearing:  Weight bearing as tolerated as taught in physical therapy.  Use a                                walker or Crutches as instructed.  To prevent constipation: you may use a stool softener such as -               Colace ( over the counter) 100 mg by mouth twice a day                Drink plenty of fluids ( prune juice may be helpful) and high fiber foods                Miralax ( over the counter) for constipation as needed.    Precautions:  If you experience chest pain or shortness of breath - call 911 immediately               For transfer to the hospital emergency department!!               If you develop a fever greater that 101 F, purulent drainage from wound,                             increased redness or drainage from wound, or calf pain -- Call the office.  Follow- Up Appointment:  Please call for an appointment to be seen on 04/08/15                                              [facility] office:  [phone]            [address] [place], [place] [address]

## 2015-04-05 NOTE — Op Note (Signed)
Dictation Number:  I dictated and pushed close with out writing down the number.  Hopefully its on the system

## 2015-04-06 ENCOUNTER — Encounter (HOSPITAL_COMMUNITY): Payer: Self-pay | Admitting: Orthopedic Surgery

## 2015-04-06 NOTE — Anesthesia Procedure Notes (Signed)
Procedure Name: LMA Insertion Performed by: Mariea Clonts Patient Re-evaluated:Patient Re-evaluated prior to inductionOxygen Delivery Method: Circle system utilized LMA: LMA inserted LMA Size: 4.0 Number of attempts: 1 Placement Confirmation: positive ETCO2 and CO2 detector Tube secured with: Tape Dental Injury: Teeth and Oropharynx as per pre-operative assessment

## 2015-04-06 NOTE — Anesthesia Postprocedure Evaluation (Signed)
  Anesthesia Post-op Note  Patient: Janice Lambert  Procedure(s) Performed: Procedure(s): ARTHROSCOPY KNEE (Right)  Patient Location: PACU  Anesthesia Type:General  Level of Consciousness: awake and alert   Airway and Oxygen Therapy: Patient Spontanous Breathing  Post-op Pain: mild  Post-op Assessment: Post-op Vital signs reviewed              Post-op Vital Signs: Reviewed  Last Vitals:  Filed Vitals:   04/05/15 1327  BP: 107/70  Pulse: 65  Temp:   Resp:     Complications: No apparent anesthesia complications

## 2015-04-06 NOTE — Op Note (Signed)
NAMEMARLAYSIA, Janice Lambert NO.:  [id number]  MEDICAL RECORD NO.:  [id number]  LOCATION:  [facility]                         FACILITY:  [facility]  PHYSICIAN:  Estill Bamberg. Ronnie Derby, M.D. DATE OF BIRTH:  1981-05-06  DATE OF PROCEDURE:  04/05/2015 DATE OF DISCHARGE:  04/05/2015                              OPERATIVE REPORT   SURGEON:  Estill Bamberg. Ronnie Derby, M.D.  ASSISTANT:  Carlynn Spry, PA-C.  ANESTHESIA:  General.  PREOPERATIVE DIAGNOSIS:  Right knee osteochondral defect, medial femoral condyle.  POSTOPERATIVE DIAGNOSES: 1. Right knee osteochondral defect, medial femoral condyle. 2. Plica syndrome.  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 Informed consent obtained.  DESCRIPTION OF PROCEDURE:  The patient was laid supine, administered general anesthesia.  The right knee was prepped and draped in usual fashion.  Inferolateral and inferomedial portals were created with a #11 blade, blunt trocar, and cannula.  Diagnostic arthroscopy revealed no chondromalacia in the patellofemoral joint.  There was a large __________ in the medial plica, which was resected with Aris [place] shaver.  We then flexed the knee, went to the notch, the ACL and PCL were normal.  I went to the figure-4 position.  Lateral sulcus was normal.  I went to the medial compartment and the medial meniscus was normal.  There was very obvious 1 x 1 cm osteochondral defect with unstable articular margins.  Once I debrided it, it was down to sclerotic bone.  I performed aggressive abrasion and arthroplasty, removing the sclerotic bone __________ technique.  I extended with a 15 blade, up and down to create a 3-cm incision.  I then created a medial parapatellar arthrotomy and removed some of the patellar fat pad.  I then cleaned out the defect with continuing to perform an abrasion arthroplasty with a small cylindrical  bur.  Once I had created sharp borders, I then opened the __________ graft and then placed it into the defect and sealed with a Tisseel glue.  I let it harden for 3 minutes, and then I closed with interrupted 2-0 Vicryl and the arthrotomy, deep soft tissues with 4-0 nylon sutures.  Dressed with Xeroform, dressing sponges, sterile Webril, and Ace wrap.  COMPLICATIONS:  None.  DRAINS:  None.          ______________________________ Estill Bamberg. Ronnie Derby, M.D.     SDL/MEDQ  D:  04/05/2015  T:  04/06/2015  Job:  [id number]

## 2015-04-06 NOTE — Transfer of Care (Signed)
Immediate Anesthesia Transfer of Care Note  Patient: Janice Lambert  Procedure(s) Performed: Procedure(s): ARTHROSCOPY KNEE (Right)  Patient Location: PACU  Anesthesia Type:General  Level of Consciousness: awake, alert  and oriented  Airway & Oxygen Therapy: Patient Spontanous Breathing and Patient connected to nasal cannula oxygen  Post-op Assessment: Report given to RN and Post -op Vital signs reviewed and stable  Post vital signs: Reviewed and stable  Last Vitals:  Filed Vitals:   04/05/15 1327  BP: 107/70  Pulse: 65  Temp:   Resp:     Complications: No apparent anesthesia complications

## 2015-04-14 ENCOUNTER — Encounter (HOSPITAL_COMMUNITY): Payer: Self-pay | Admitting: Orthopedic Surgery

## 2015-12-04 ENCOUNTER — Emergency Department (HOSPITAL_COMMUNITY)
Admission: EM | Admit: 2015-12-04 | Discharge: 2015-12-04 | Disposition: A | Discharge status: Home / Self Care | Payer: Medicaid Other | Attending: Emergency Medicine | Admitting: Emergency Medicine

## 2015-12-04 ENCOUNTER — Encounter (HOSPITAL_COMMUNITY): Payer: Self-pay

## 2015-12-04 DIAGNOSIS — Z8614 Personal history of Methicillin resistant Staphylococcus aureus infection: Secondary | ICD-10-CM | POA: Diagnosis not present

## 2015-12-04 DIAGNOSIS — R109 Unspecified abdominal pain: Secondary | ICD-10-CM | POA: Diagnosis not present

## 2015-12-04 DIAGNOSIS — Z880 Allergy status to penicillin: Secondary | ICD-10-CM | POA: Diagnosis not present

## 2015-12-04 DIAGNOSIS — Z87440 Personal history of urinary (tract) infections: Secondary | ICD-10-CM | POA: Insufficient documentation

## 2015-12-04 DIAGNOSIS — G40909 Epilepsy, unspecified, not intractable, without status epilepticus: Secondary | ICD-10-CM | POA: Diagnosis not present

## 2015-12-04 DIAGNOSIS — Z87891 Personal history of nicotine dependence: Secondary | ICD-10-CM | POA: Insufficient documentation

## 2015-12-04 DIAGNOSIS — Z789 Other specified health status: Secondary | ICD-10-CM | POA: Insufficient documentation

## 2015-12-04 DIAGNOSIS — F419 Anxiety disorder, unspecified: Secondary | ICD-10-CM | POA: Diagnosis not present

## 2015-12-04 DIAGNOSIS — Z86018 Personal history of other benign neoplasm: Secondary | ICD-10-CM | POA: Diagnosis not present

## 2015-12-04 DIAGNOSIS — M1990 Unspecified osteoarthritis, unspecified site: Secondary | ICD-10-CM | POA: Diagnosis not present

## 2015-12-04 DIAGNOSIS — E860 Dehydration: Secondary | ICD-10-CM | POA: Diagnosis present

## 2015-12-04 DIAGNOSIS — Z87442 Personal history of urinary calculi: Secondary | ICD-10-CM | POA: Insufficient documentation

## 2015-12-04 DIAGNOSIS — R112 Nausea with vomiting, unspecified: Secondary | ICD-10-CM | POA: Insufficient documentation

## 2015-12-04 DIAGNOSIS — Z3202 Encounter for pregnancy test, result negative: Secondary | ICD-10-CM | POA: Diagnosis not present

## 2015-12-04 DIAGNOSIS — Z8701 Personal history of pneumonia (recurrent): Secondary | ICD-10-CM | POA: Insufficient documentation

## 2015-12-04 DIAGNOSIS — Q6100 Congenital renal cyst, unspecified: Secondary | ICD-10-CM | POA: Insufficient documentation

## 2015-12-04 DIAGNOSIS — R630 Anorexia: Secondary | ICD-10-CM | POA: Diagnosis not present

## 2015-12-04 DIAGNOSIS — Z79899 Other long term (current) drug therapy: Secondary | ICD-10-CM | POA: Insufficient documentation

## 2015-12-04 LAB — HEPATIC FUNCTION PANEL
ALBUMIN: 4.2 g/dL (ref 3.5–5.0)
ALT: 17 U/L (ref 14–54)
AST: 24 U/L (ref 15–41)
Alkaline Phosphatase: 65 U/L (ref 38–126)
Bilirubin, Direct: 0.1 mg/dL (ref 0.1–0.5)
Indirect Bilirubin: 0.4 mg/dL (ref 0.3–0.9)
Total Bilirubin: 0.5 mg/dL (ref 0.3–1.2)
Total Protein: 7.7 g/dL (ref 6.5–8.1)

## 2015-12-04 LAB — URINALYSIS, ROUTINE W REFLEX MICROSCOPIC
Bilirubin Urine: NEGATIVE
GLUCOSE, UA: NEGATIVE mg/dL
Ketones, ur: NEGATIVE mg/dL
Nitrite: NEGATIVE
PROTEIN: NEGATIVE mg/dL
SPECIFIC GRAVITY, URINE: 1.026 (ref 1.005–1.030)
pH: 5.5 (ref 5.0–8.0)

## 2015-12-04 LAB — URINE MICROSCOPIC-ADD ON

## 2015-12-04 LAB — CBC
HCT: 45.9 % (ref 36.0–46.0)
HEMOGLOBIN: 16.2 g/dL — AB (ref 12.0–15.0)
MCH: 29.8 pg (ref 26.0–34.0)
MCHC: 35.3 g/dL (ref 30.0–36.0)
MCV: 84.4 fL (ref 78.0–100.0)
PLATELETS: 377 10*3/uL (ref 150–400)
RBC: 5.44 MIL/uL — ABNORMAL HIGH (ref 3.87–5.11)
RDW: 13 % (ref 11.5–15.5)
WBC: 11.7 10*3/uL — AB (ref 4.0–10.5)

## 2015-12-04 LAB — LIPASE, BLOOD: LIPASE: 32 U/L (ref 11–51)

## 2015-12-04 LAB — BASIC METABOLIC PANEL
ANION GAP: 10 (ref 5–15)
BUN: 19 mg/dL (ref 6–20)
CO2: 22 mmol/L (ref 22–32)
CREATININE: 1.13 mg/dL — AB (ref 0.44–1.00)
Calcium: 9.4 mg/dL (ref 8.9–10.3)
Chloride: 105 mmol/L (ref 101–111)
GFR calc Af Amer: >60 mL/min (ref >60)
GFR calc non Af Amer: >60 mL/min (ref >60)
Glucose, Bld: 92 mg/dL (ref 65–99)
Potassium: 4.1 mmol/L (ref 3.5–5.1)
SODIUM: 137 mmol/L (ref 135–145)

## 2015-12-04 LAB — I-STAT BETA HCG BLOOD, ED (MC, WL, AP ONLY): I-stat hCG, quantitative: <5 m[IU]/mL (ref <5)

## 2015-12-04 LAB — POC URINE PREG, ED: PREG TEST UR: NEGATIVE

## 2015-12-04 MED ORDER — SODIUM CHLORIDE 0.9 % IV BOLUS (SEPSIS)
1000.0000 mL | Freq: Once | INTRAVENOUS | Status: AC
  Administered 2015-12-04: 1000 mL via INTRAVENOUS

## 2015-12-04 MED ORDER — KETOROLAC TROMETHAMINE 30 MG/ML IJ SOLN: 30.0000 mg | Freq: Once | INTRAMUSCULAR | Status: DC

## 2015-12-04 MED ORDER — ONDANSETRON 4 MG PO TBDP: 4.0000 mg | ORAL_TABLET | Freq: Three times a day (TID) | ORAL | Status: AC | PRN

## 2015-12-04 MED ORDER — ONDANSETRON HCL 4 MG/2ML IJ SOLN
4.0000 mg | Freq: Once | INTRAMUSCULAR | Status: AC
  Administered 2015-12-04: 4 mg via INTRAVENOUS
  Filled 2015-12-04: qty 2

## 2015-12-04 NOTE — ED Provider Notes (Signed)
CSN: [id number]     Arrival date & time 12/04/15  0100 History  By signing my name below, I, Janice Lambert, attest that this documentation has been prepared under the direction and in the presence of Everlene Balls, MD. Electronically Signed: Judithann Lambert, ED Scribe. 12/04/2015. 3:37 AM.    Chief Complaint  Patient presents with  . Dehydration   The history is provided by the patient. No language interpreter was used.   HPI Comments: Janice Lambert is a 35 y.o. female who presents to the Emergency Department complaining of gradually worsening moderate right abdominal pain that radiates to her right back onset 2 days ago. She reports associated abdominal distension, burning in her abdomen with eating, decreased BMs, dehydration and one episode of syncope PTA. She adds that her abdominal pain improves with applied pressure. She explains that she recently passed kidney stones and states that has been self-catherizing for approx. 2 years now. No n/v/d.   Past Medical History  Diagnosis Date  . Pneumonia     6 or 7 times as a child  . Anxiety   . Kidney stones   . Kidney cysts     right kidney  . Frequent UTI     pt self caths, not as bad since she is boiling catheters more often  . Epilepsy [facility])     Adult onset, last seizure 05/2014  . Headache     will get prior to seizure  . Arthritis     osteoarthritis right knee and hip  . Colitis   . Fibroid tumor     hx of - had hysterectomy  . PONV (postoperative nausea and vomiting)     after wisdom teeth surgery. Had hysterectomy since and didn't have any n/v with it  . Hx MRSA infection     07/2014  . Self-catheterizes urinary bladder    Past Surgical History  Procedure Laterality Date  . Cholecystectomy    . Abdominal hysterectomy    . Cesarean section    . Uterine artery embolization    . Colonoscopy  11/2012  . Wisdom tooth extraction    . Knee arthroscopy Right 04/05/2015    Procedure: ARTHROSCOPY KNEE;  Surgeon: Vickey Huger, MD;  Location: [place];  Service: Orthopedics;  Laterality: Right;   Family History  Problem Relation Age of Onset  . CAD Mother   . Hypertension Mother   . Diabetes type II Mother    Social History  Substance Use Topics  . Smoking status: Former Smoker    Quit date: 03/31/1998  . Smokeless tobacco: Never Used  . Alcohol Use: Yes     Comment: rare   OB History    No data available     Review of Systems  Constitutional: Positive for appetite change. Negative for fever and chills.  Gastrointestinal: Positive for abdominal pain and abdominal distention. Negative for nausea, vomiting and diarrhea.  All other systems reviewed and are negative.     Allergies  Penicillins and Morphine and related  Home Medications   Prior to Admission medications   Medication Sig Start Date End Date Taking? Authorizing Provider  ALPRAZolam Duanne Moron) 1 MG tablet Take 1 mg by mouth every 8 (eight) hours as needed. for anxiety 03/17/15   Historical Provider, MD  diphenhydrAMINE (BENADRYL) 25 MG tablet Take 25 mg by mouth every 6 (six) hours as needed for itching.    Historical Provider, MD  diphenoxylate-atropine (LOMOTIL) 2.5-0.025 MG per tablet Take 2 tablets by  mouth 4 (four) times daily as needed for diarrhea or loose stools. 06/12/12   Evalee Jefferson, PA-C  ibuprofen (ADVIL,MOTRIN) 800 MG tablet Take 1 tablet (800 mg total) by mouth every 8 (eight) hours as needed. 04/05/15   Carlynn Spry, PA-C  Menthol, Topical Analgesic, (ZIMS MAX-FREEZE EX) Apply topically. Uses it 5-6 times a day    Historical Provider, MD  Oxycodone HCl 10 MG TABS Take 1-2 tablets (10-20 mg total) by mouth every 4 (four) hours as needed. 04/05/15   Carlynn Spry, PA-C  topiramate (TOPAMAX) 50 MG tablet Take 50 mg by mouth 2 (two) times daily. 03/17/15   Historical Provider, MD  zolpidem (AMBIEN) 10 MG tablet Take 10 mg by mouth at bedtime. 03/17/15   Historical Provider, MD   BP 103/80 mmHg  Pulse 64  Temp(Src) 97.7 F (36.5 C)  (Oral)  Resp 16  SpO2 97% Physical Exam  Constitutional: She is oriented to person, place, and time. She appears well-developed and well-nourished. No distress.  HENT:  Head: Normocephalic and atraumatic.  Nose: Nose normal.  Mouth/Throat: Oropharynx is clear and moist. No oropharyngeal exudate.  Eyes: Conjunctivae and EOM are normal. Pupils are equal, round, and reactive to light. No scleral icterus.  Neck: Normal range of motion. Neck supple. No JVD present. No tracheal deviation present. No thyromegaly present.  Cardiovascular: Normal rate, regular rhythm and normal heart sounds.  Exam reveals no gallop and no friction rub.   No murmur heard. Pulmonary/Chest: Effort normal and breath sounds normal. No respiratory distress. She has no wheezes. She exhibits no tenderness.  Abdominal: Soft. Bowel sounds are normal. She exhibits no distension and no mass. There is no tenderness. There is no rebound and no guarding.  Musculoskeletal: Normal range of motion. She exhibits no edema or tenderness.  Lymphadenopathy:    She has no cervical adenopathy.  Neurological: She is alert and oriented to person, place, and time. No cranial nerve deficit. She exhibits normal muscle tone.  Skin: Skin is warm and dry. No rash noted. No erythema. No pallor.  Nursing note and vitals reviewed.   ED Course  Procedures (including critical care time) DIAGNOSTIC STUDIES: Oxygen Saturation is 97% on RA, normal by my interpretation.    COORDINATION OF CARE: 3:27 AM- Pt advised of plan for treatment and pt agrees. Pt will receive IV fluids and Zofran.    Labs Review Labs Reviewed  BASIC METABOLIC PANEL - Abnormal; Notable for the following:    Creatinine, Ser 1.13 (*)    All other components within normal limits  CBC - Abnormal; Notable for the following:    WBC 11.7 (*)    RBC 5.44 (*)    Hemoglobin 16.2 (*)    All other components within normal limits  URINALYSIS, ROUTINE W REFLEX MICROSCOPIC (NOT AT  [facility]) - Abnormal; Notable for the following:    APPearance CLOUDY (*)    Hgb urine dipstick SMALL (*)    Leukocytes, UA SMALL (*)    All other components within normal limits  URINE MICROSCOPIC-ADD ON - Abnormal; Notable for the following:    Squamous Epithelial / LPF 0-5 (*)    Bacteria, UA RARE (*)    All other components within normal limits  HEPATIC FUNCTION PANEL  LIPASE, BLOOD  I-STAT BETA HCG BLOOD, ED ([facility], [facility], [facility] ONLY)  POC URINE PREG, ED    Imaging Review No results found.   Everlene Balls, MD has personally reviewed and evaluated these images and lab results as  part of his medical decision-making.  MDM   Final diagnoses:  None    patient presents to emergency department for possible dehydration. She states she has not been abl to tolerate any foo or drink vascular days. She states she's recently assed multiple kidney stones is having some abdominal pain with nausea.she denies any changes in her bowel movements.  Physical exam is unremarkable Laboratory stdy shows mild dehydration. She was given 1 L of IV fluids. She was given Zofran for nausea. We'll discharge home with a prescription. She appears well-developed acute distress, vital signs were within her normal limit and she is safe for discharge.    I personally performed the services described in this documentation, which was scribed in my presence. The recorded information has been reviewed and is accurate.     Everlene Balls, MD 12/04/15 [phone]

## 2015-12-04 NOTE — ED Notes (Signed)
Pt states she passed 5 or 6 kidney stones last Friday. She states she feels more dehydrated and feels weak and is unable to tolerate PO. She reports lightheadness from the dehydration. She went to [facility] in [place] earlier today and was told to come to the ER for dehydration.

## 2015-12-04 NOTE — Discharge Instructions (Signed)
Nausea and Vomiting Janice Lambert, your blood work shows very mild dehydration.  Take zofran for nausea and drink plenty of fluids. See your primary care doctor within 3 days for close follow up. If symptoms worsen, come back to the ED immediately. Thank you. Nausea means you feel sick to your stomach. Throwing up (vomiting) is a reflex where stomach contents come out of your mouth. HOME CARE   Take medicine as told by your doctor.  Do not force yourself to eat. However, you do need to drink fluids.  If you feel like eating, eat a normal diet as told by your doctor.  Eat rice, wheat, potatoes, bread, lean meats, yogurt, fruits, and vegetables.  Avoid high-fat foods.  Drink enough fluids to keep your pee (urine) clear or pale yellow.  Ask your doctor how to replace body fluid losses (rehydrate). Signs of body fluid loss (dehydration) include:  Feeling very thirsty.  Dry lips and mouth.  Feeling dizzy.  Dark pee.  Peeing less than normal.  Feeling confused.  Fast breathing or heart rate. GET HELP RIGHT AWAY IF:   You have blood in your throw up.  You have black or bloody poop (stool).  You have a bad headache or stiff neck.  You feel confused.  You have bad belly (abdominal) pain.  You have chest pain or trouble breathing.  You do not pee at least once every 8 hours.  You have cold, clammy skin.  You keep throwing up after 24 to 48 hours.  You have a fever. MAKE SURE YOU:   Understand these instructions.  Will watch your condition.  Will get help right away if you are not doing well or get worse.   This information is not intended to replace advice given to you by your health care provider. Make sure you discuss any questions you have with your health care provider.   Document Released: 02/14/2008 Document Revised: 11/20/2011 Document Reviewed: 01/27/2011 Elsevier Interactive Patient Education [employer].

## 2015-12-04 NOTE — ED Notes (Addendum)
Pt had kidney stones almost 7 days ago. Has been having pain and bleeding since then ( spotting). C/o pain in RLQ and lower back. Stomach has been distended. Has been self-catherizing for over 2 years. However she has noticed less urine production over last 36- hours. Change in appetite, decline in eating and drinking, hurts stomach. C/o nausea. Pass out today. Found on floor. States she lightheaded and is seeing spots when she stands up.

## 2016-08-26 DIAGNOSIS — G8929 Other chronic pain: Secondary | ICD-10-CM

## 2016-08-26 DIAGNOSIS — L03211 Cellulitis of face: Secondary | ICD-10-CM | POA: Diagnosis not present

## 2016-08-26 DIAGNOSIS — G4700 Insomnia, unspecified: Secondary | ICD-10-CM

## 2016-08-26 DIAGNOSIS — E119 Type 2 diabetes mellitus without complications: Secondary | ICD-10-CM

## 2016-08-26 DIAGNOSIS — G40909 Epilepsy, unspecified, not intractable, without status epilepticus: Secondary | ICD-10-CM

## 2016-08-26 DIAGNOSIS — A419 Sepsis, unspecified organism: Secondary | ICD-10-CM | POA: Diagnosis not present

## 2016-08-26 DIAGNOSIS — F419 Anxiety disorder, unspecified: Secondary | ICD-10-CM

## 2016-08-26 DIAGNOSIS — E669 Obesity, unspecified: Secondary | ICD-10-CM

## 2016-08-26 DIAGNOSIS — L0201 Cutaneous abscess of face: Secondary | ICD-10-CM | POA: Diagnosis not present

## 2016-08-26 DIAGNOSIS — M1990 Unspecified osteoarthritis, unspecified site: Secondary | ICD-10-CM

## 2016-08-27 DIAGNOSIS — A419 Sepsis, unspecified organism: Secondary | ICD-10-CM | POA: Diagnosis not present

## 2016-08-27 DIAGNOSIS — E119 Type 2 diabetes mellitus without complications: Secondary | ICD-10-CM | POA: Diagnosis not present

## 2016-08-27 DIAGNOSIS — L0201 Cutaneous abscess of face: Secondary | ICD-10-CM | POA: Diagnosis not present

## 2016-08-27 DIAGNOSIS — L03211 Cellulitis of face: Secondary | ICD-10-CM | POA: Diagnosis not present

## 2016-08-28 DIAGNOSIS — A419 Sepsis, unspecified organism: Secondary | ICD-10-CM | POA: Diagnosis not present

## 2016-08-28 DIAGNOSIS — L0201 Cutaneous abscess of face: Secondary | ICD-10-CM | POA: Diagnosis not present

## 2016-08-28 DIAGNOSIS — E119 Type 2 diabetes mellitus without complications: Secondary | ICD-10-CM | POA: Diagnosis not present

## 2016-08-28 DIAGNOSIS — L03211 Cellulitis of face: Secondary | ICD-10-CM | POA: Diagnosis not present

## 2018-07-01 DIAGNOSIS — E119 Type 2 diabetes mellitus without complications: Secondary | ICD-10-CM

## 2018-07-01 DIAGNOSIS — Z79899 Other long term (current) drug therapy: Secondary | ICD-10-CM

## 2018-07-01 DIAGNOSIS — R51 Headache: Secondary | ICD-10-CM

## 2018-07-01 DIAGNOSIS — R569 Unspecified convulsions: Secondary | ICD-10-CM
